# Patient Record
Sex: Male | Born: 1950 | Race: Black or African American | Hispanic: No | Smoking: Never smoker
Health system: Southern US, Community
[De-identification: ages and names within clinical notes are randomized; demographics above are authoritative.]

---

## 1998-09-08 ENCOUNTER — Emergency Department (HOSPITAL_COMMUNITY): Admission: EM | Admit: 1998-09-08 | Discharge: 1998-09-08 | Payer: Self-pay | Admitting: Emergency Medicine

## 1998-09-08 ENCOUNTER — Encounter: Payer: Self-pay | Admitting: Emergency Medicine

## 2013-08-25 ENCOUNTER — Emergency Department (INDEPENDENT_AMBULATORY_CARE_PROVIDER_SITE_OTHER): Payer: Self-pay

## 2013-08-25 ENCOUNTER — Encounter (HOSPITAL_COMMUNITY): Payer: Self-pay | Admitting: Emergency Medicine

## 2013-08-25 ENCOUNTER — Emergency Department (INDEPENDENT_AMBULATORY_CARE_PROVIDER_SITE_OTHER)
Admission: EM | Admit: 2013-08-25 | Discharge: 2013-08-25 | Disposition: A | Discharge status: Home / Self Care | Payer: Self-pay | Source: Home / Self Care

## 2013-08-25 DIAGNOSIS — R918 Other nonspecific abnormal finding of lung field: Secondary | ICD-10-CM

## 2013-08-25 DIAGNOSIS — R0989 Other specified symptoms and signs involving the circulatory and respiratory systems: Secondary | ICD-10-CM

## 2013-08-25 DIAGNOSIS — R0609 Other forms of dyspnea: Secondary | ICD-10-CM

## 2013-08-25 DIAGNOSIS — S0993XA Unspecified injury of face, initial encounter: Secondary | ICD-10-CM

## 2013-08-25 DIAGNOSIS — Z041 Encounter for examination and observation following transport accident: Secondary | ICD-10-CM

## 2013-08-25 DIAGNOSIS — Z043 Encounter for examination and observation following other accident: Secondary | ICD-10-CM

## 2013-08-25 DIAGNOSIS — S025XXA Fracture of tooth (traumatic), initial encounter for closed fracture: Secondary | ICD-10-CM

## 2013-08-25 DIAGNOSIS — R0600 Dyspnea, unspecified: Secondary | ICD-10-CM

## 2013-08-25 MED ORDER — ALBUTEROL SULFATE HFA 108 (90 BASE) MCG/ACT IN AERS: 2.0000 | INHALATION_SPRAY | RESPIRATORY_TRACT | Status: AC | PRN
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
## 2013-08-25 NOTE — Discharge Instructions (Signed)
Dental Injury Your exam shows that you have injured your teeth. The treatment of broken teeth and other dental injuries depends on how badly they are hurt. All dental injuries should be checked as soon as possible by a dentist if there are:  Loose teeth which may need to be wired or bonded with a plastic device to hold them in place.  Broken teeth with exposed tooth pulp which may cause a serious infection.  Painful teeth especially when you bite or chew.  Sharp tooth edges that cut your tongue or lips. Sometimes, antibiotics or pain medicine are prescribed to prevent infection and control pain. Eat a soft or liquid diet and rinse your mouth out after meals with warm water. You should see a dentist or return here at once if you have increased swelling, increased pain or uncontrolled bleeding from the site of your injury. SEEK MEDICAL CARE IF:   You have increased pain not controlled with medicines.  You have swelling around your tooth, in your face or neck.  You have bleeding which starts, continues, or gets worse.  You have a fever. Document Released: 05/19/2005 Document Revised: 08/11/2011 Document Reviewed: 05/18/2009 [facility] Patient Information 2014 [place], [place].  Shortness of Breath Shortness of breath means you have trouble breathing. Shortness of breath may indicate that you have a medical problem. You should seek immediate medical care for shortness of breath. CAUSES   Not enough oxygen in the air (as with high altitudes or a smoke-filled room).  Short-term (acute) lung disease, including:  Infections, such as pneumonia.  Fluid in the lungs, such as heart failure.  A blood clot in the lungs (pulmonary embolism).  Long-term (chronic) lung diseases.  Heart disease (heart attack, angina, heart failure, and others).  Low red blood cells (anemia).  Poor physical fitness. This can cause shortness of breath when you exercise.  Chest or back injuries or  stiffness.  Being overweight.  Smoking.  Anxiety. This can make you feel like you are not getting enough air. DIAGNOSIS  Serious medical problems can usually be found during your physical exam. Tests may also be done to determine why you are having shortness of breath. Tests may include:  Chest X-rays.  Lung function tests.  Blood tests.  Electrocardiography.  Exercise testing.  Echocardiography.  Imaging scans. Your caregiver may not be able to find a cause for your shortness of breath after your exam. In this case, it is important to have a follow-up exam with your caregiver as directed.  TREATMENT  Treatment for shortness of breath depends on the cause of your symptoms and can vary greatly. HOME CARE INSTRUCTIONS   Do not smoke. Smoking is a common cause of shortness of breath. If you smoke, ask for help to quit.  Avoid being around chemicals or things that may bother your breathing, such as paint fumes and dust.  Rest as needed. Slowly resume your usual activities.  If medicines were prescribed, take them as directed for the full length of time directed. This includes oxygen and any inhaled medicines.  Keep all follow-up appointments as directed by your caregiver. SEEK MEDICAL CARE IF:   Your condition does not improve in the time expected.  You have a hard time doing your normal activities even with rest.  You have any side effects or problems with the medicines prescribed.  You develop any new symptoms. SEEK IMMEDIATE MEDICAL CARE IF:   Your shortness of breath gets worse.  You feel lightheaded, faint, or develop  a cough not controlled with medicines.  You start coughing up blood.  You have pain with breathing.  You have chest pain or pain in your arms, shoulders, or abdomen.  You have a fever.  You are unable to walk up stairs or exercise the way you normally do. MAKE SURE YOU:  Understand these instructions.  Will watch your condition.  Will  get help right away if you are not doing well or get worse. Document Released: 02/11/2001 Document Revised: 11/18/2011 Document Reviewed: 08/04/2011 [facility] Patient Information 2014 [place], [place].

## 2013-08-25 NOTE — ED Provider Notes (Signed)
Medical screening examination/treatment/procedure(s) were performed by a resident physician or non-physician practitioner and as the supervising physician I was immediately available for consultation/collaboration.  Evan Corey, MD    Evan S Corey, MD 08/25/13 1814 
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
## 2014-04-11 ENCOUNTER — Encounter (HOSPITAL_COMMUNITY): Payer: Self-pay | Admitting: *Deleted

## 2014-04-11 ENCOUNTER — Emergency Department (INDEPENDENT_AMBULATORY_CARE_PROVIDER_SITE_OTHER)
Admission: EM | Admit: 2014-04-11 | Discharge: 2014-04-11 | Disposition: A | Discharge status: Home / Self Care | Payer: Medicare Other | Source: Home / Self Care | Attending: Family Medicine | Admitting: Family Medicine

## 2014-04-11 DIAGNOSIS — M7552 Bursitis of left shoulder: Secondary | ICD-10-CM

## 2014-04-11 MED ORDER — TRIAMCINOLONE ACETONIDE 40 MG/ML IJ SUSP
INTRAMUSCULAR | Status: AC
  Filled 2014-04-11: qty 2

## 2014-04-11 MED ORDER — BUPIVACAINE HCL (PF) 0.5 % IJ SOLN
INTRAMUSCULAR | Status: AC
  Filled 2014-04-11: qty 10

## 2014-04-11 MED ORDER — DICLOFENAC SODIUM 1 % TD GEL: 4.0000 g | Freq: Four times a day (QID) | TRANSDERMAL | Status: AC

## 2014-04-11 NOTE — ED Provider Notes (Signed)
CSN: [id number]     Arrival date & time 04/11/14  1039 History   First MD Initiated Contact with Patient 04/11/14 1103     Chief Complaint  Patient presents with  . Shoulder Pain   (Consider location/radiation/quality/duration/timing/severity/associated sxs/prior Treatment) Patient is a 63 y.o. male presenting with shoulder pain. The history is provided by the patient.  Shoulder Pain Location:  Shoulder Time since incident:  1 week Injury: no (concern for new mattress contributing during sleep , NKI.)   Shoulder location:  L shoulder Pain details:    Quality:  Throbbing   Radiates to:  Does not radiate   Severity:  Moderate   Onset quality:  Gradual   Progression:  Worsening Chronicity:  New Dislocation: no   Prior injury to area:  No Worsened by:  Nothing tried Ineffective treatments:  None tried Associated symptoms: no back pain, no decreased range of motion, no fever, no neck pain, no stiffness and no tingling     History reviewed. No pertinent past medical history. History reviewed. No pertinent past surgical history. History reviewed. No pertinent family history. History  Substance Use Topics  . Smoking status: Never Smoker   . Smokeless tobacco: Not on file  . Alcohol Use: Yes    Review of Systems  Constitutional: Negative for fever.  Musculoskeletal: Negative for back pain, joint swelling, stiffness and neck pain.    Allergies  Review of patient's allergies indicates no known allergies.  Home Medications   Prior to Admission medications   Medication Sig Start Date End Date Taking? Authorizing Provider  albuterol (PROVENTIL HFA;VENTOLIN HFA) 108 (90 BASE) MCG/ACT inhaler Inhale 2 puffs into the lungs every 4 (four) hours as needed for wheezing or shortness of breath. 08/25/13   Hayden Rasmussenavid Mabe, NP  diclofenac sodium (VOLTAREN) 1 % GEL Apply 4 g topically 4 (four) times daily. To shoulder, please instruct in dosing. 04/11/14   Linna HoffJames D Ysela Hettinger, MD   BP 129/80 mmHg   Temp(Src) 98.3 F (36.8 C) (Oral)  Resp 16  SpO2 98% Physical Exam  Constitutional: He is oriented to person, place, and time. He appears well-developed and well-nourished.  Musculoskeletal: He exhibits tenderness.       Left shoulder: He exhibits tenderness and pain. He exhibits normal range of motion, no bony tenderness, no swelling, no effusion, no deformity, no spasm, normal pulse and normal strength.  Neurological: He is alert and oriented to person, place, and time.  Skin: Skin is warm and dry.  Nursing note and vitals reviewed.   ED Course  ARTHOCENTESIS Date/Time: 04/11/2014 11:16 AM Performed by: Linna HoffKINDL, Christinamarie Tall D Authorized by: Bradd CanaryKINDL, Taran Hable D Consent: Verbal consent obtained. Consent given by: patient Indications: pain  Body area: shoulder Joint: left shoulder Local anesthesia used: no Patient sedated: no Preparation: Patient was prepped and draped in the usual sterile fashion. Needle gauge: 20 G Ultrasound guidance: no Approach: lateral Triamcinolone amount (mg): 80. Bupivacaine 0.50% amount (ml): 5. Patient tolerance: Patient tolerated the procedure well with no immediate complications Comments: Sx improved after inj.   (including critical care time) Labs Review Labs Reviewed - No data to display  Imaging Review No results found.   MDM   1. Bursitis of deltoid, left        Linna HoffJames D Brucha Ahlquist, MD 04/11/14 1136

## 2014-04-11 NOTE — Discharge Instructions (Signed)
Use ice sling and medicine as needed and see orthopedist if further problems.

## 2014-04-11 NOTE — ED Notes (Signed)
Pt  Reports  Pain  l  Shoulder        X  10  Days       denys  Any  specefic  Injury  rom  Is  Present

## 2014-08-08 IMAGING — CR DG CHEST 2V
4 series · 4 of 4 positions shown · non-contrast
Comparison: None.

CLINICAL DATA: Chest pain and shortness of breath

EXAM:
CHEST  2 VIEW

[view not recorded (1 of 4)]
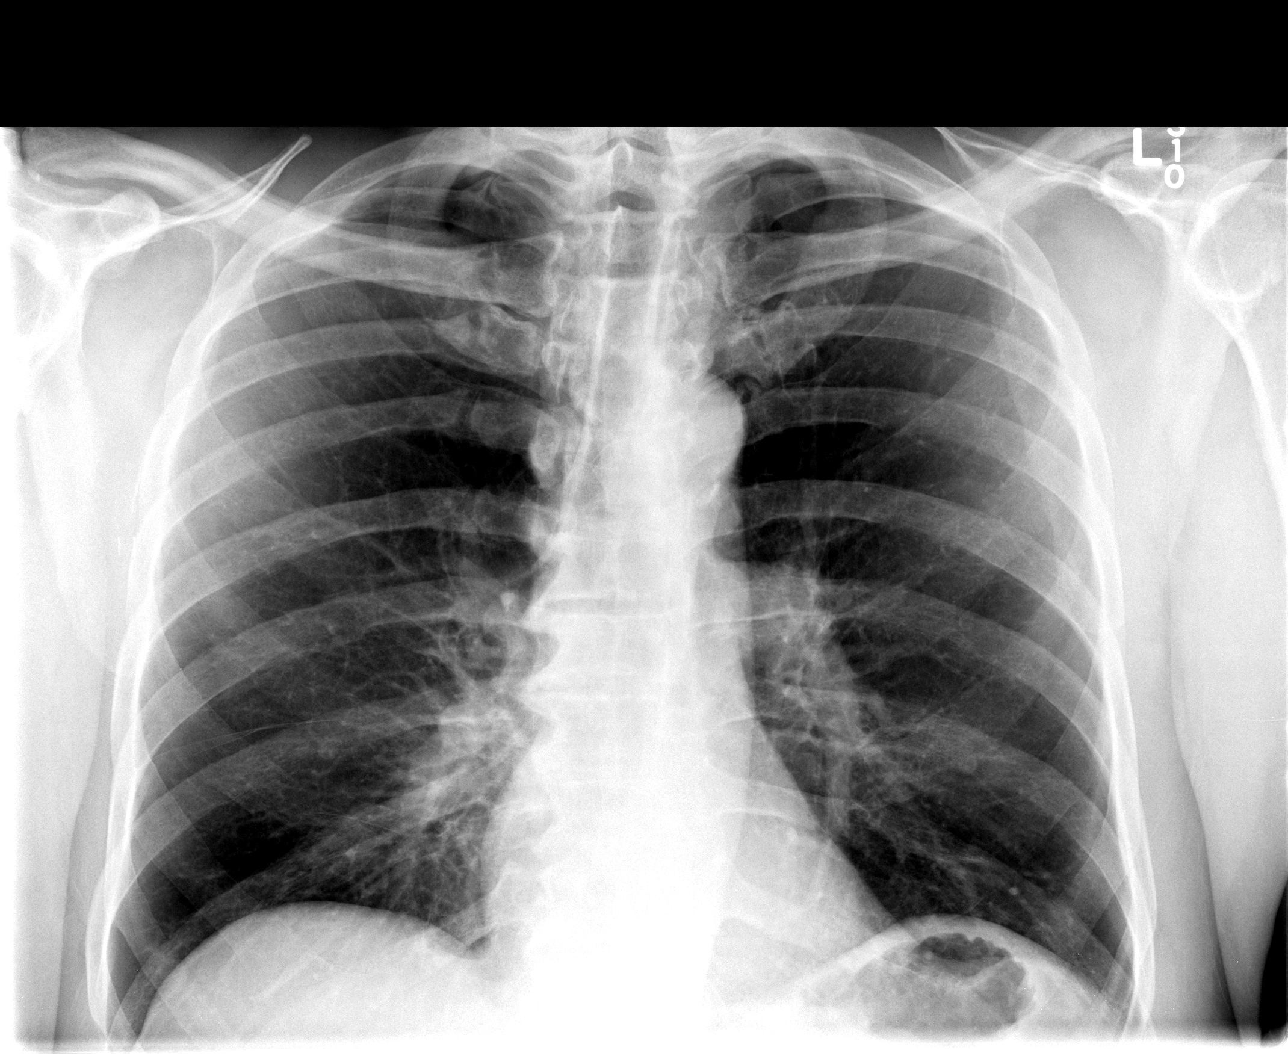

[view not recorded (2 of 4)]
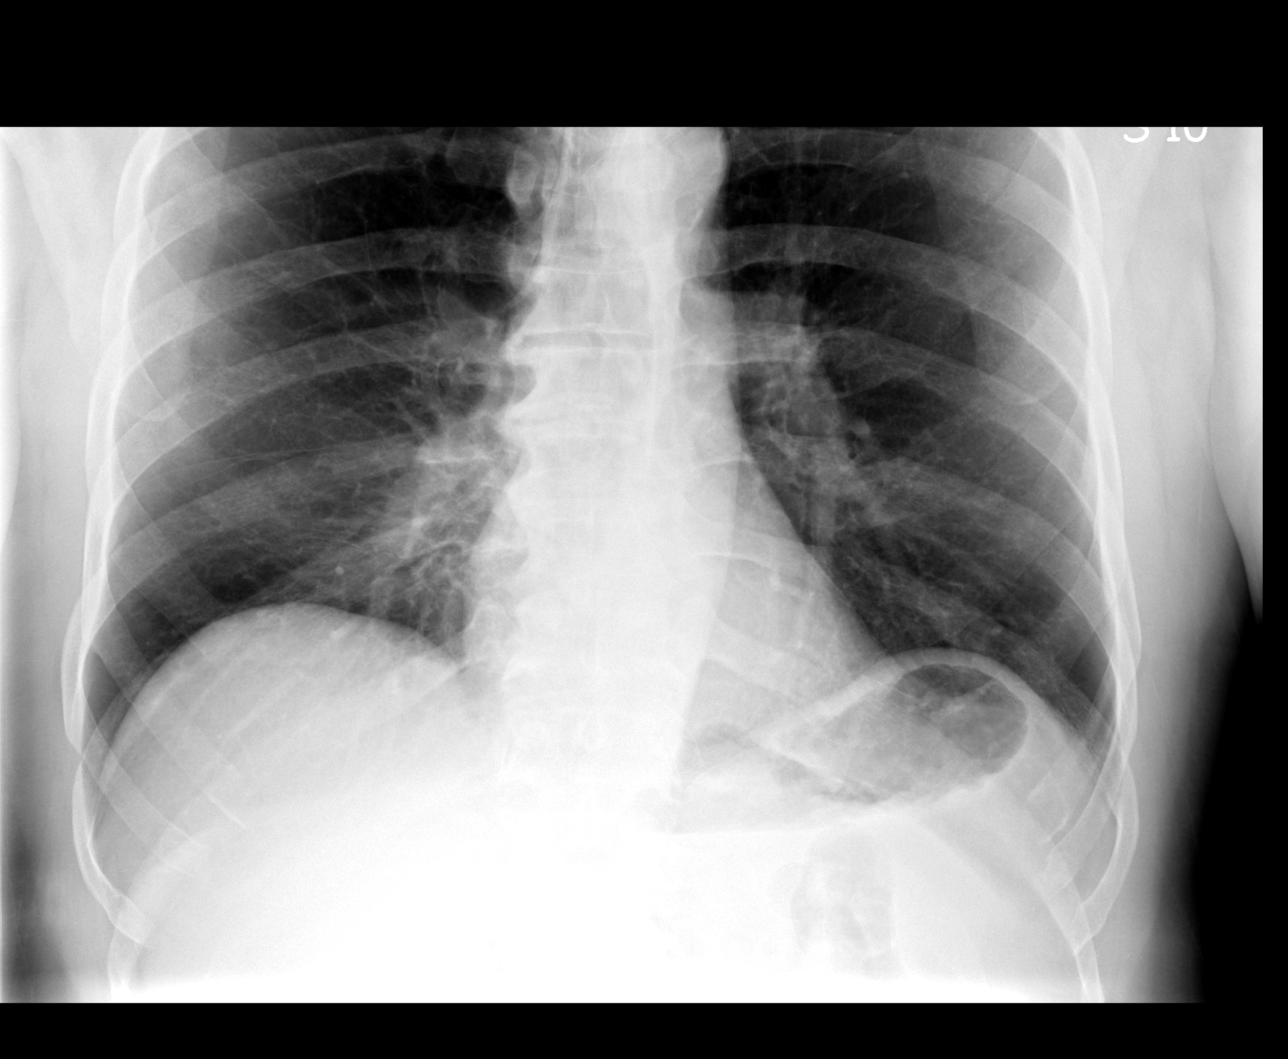

[view not recorded (3 of 4)]
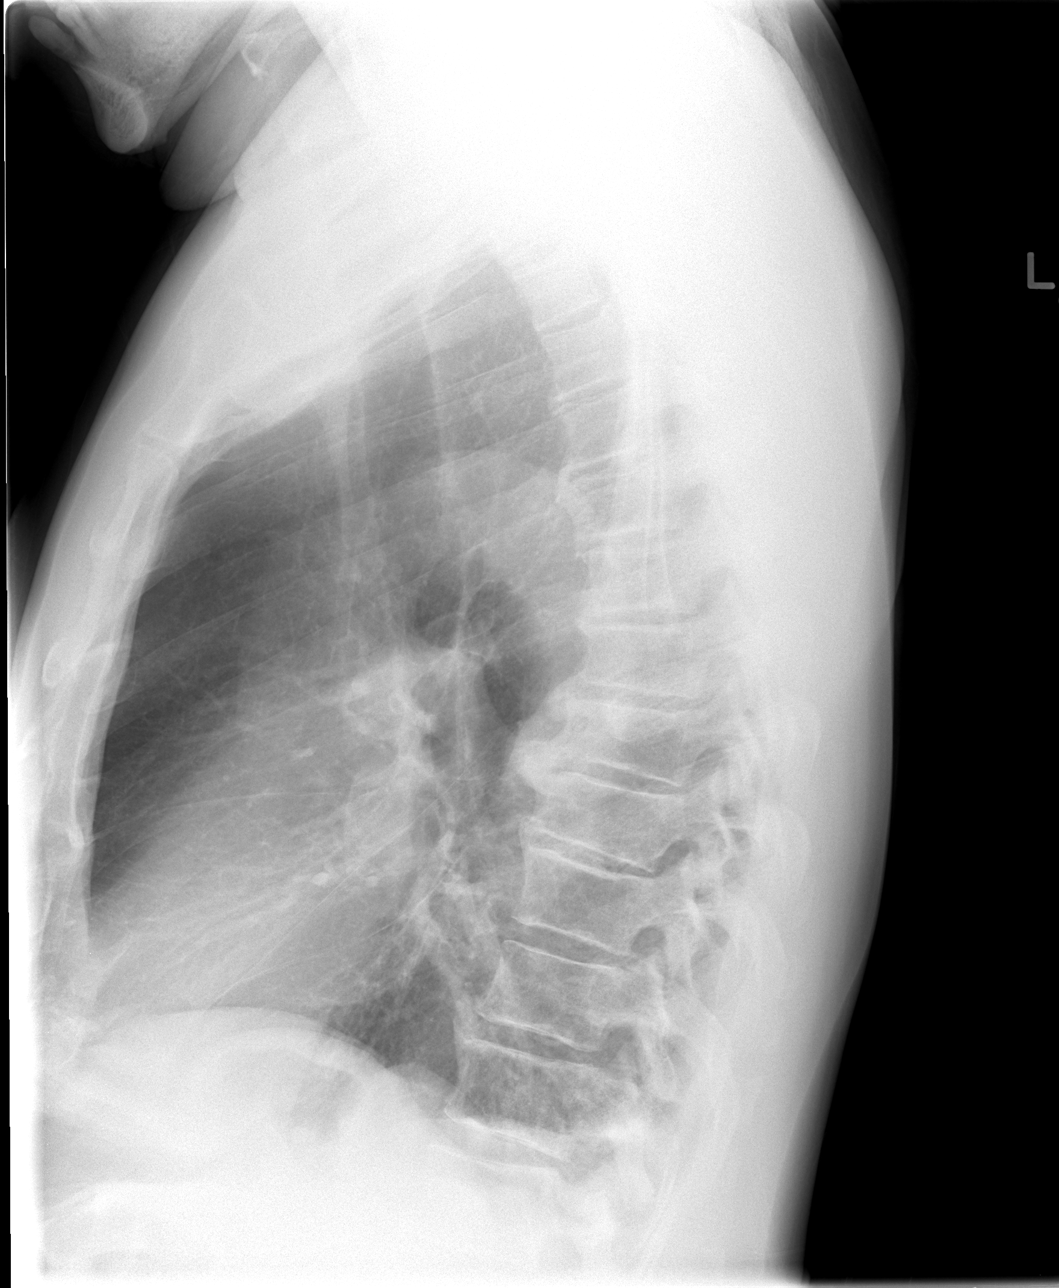

[view not recorded (4 of 4)]
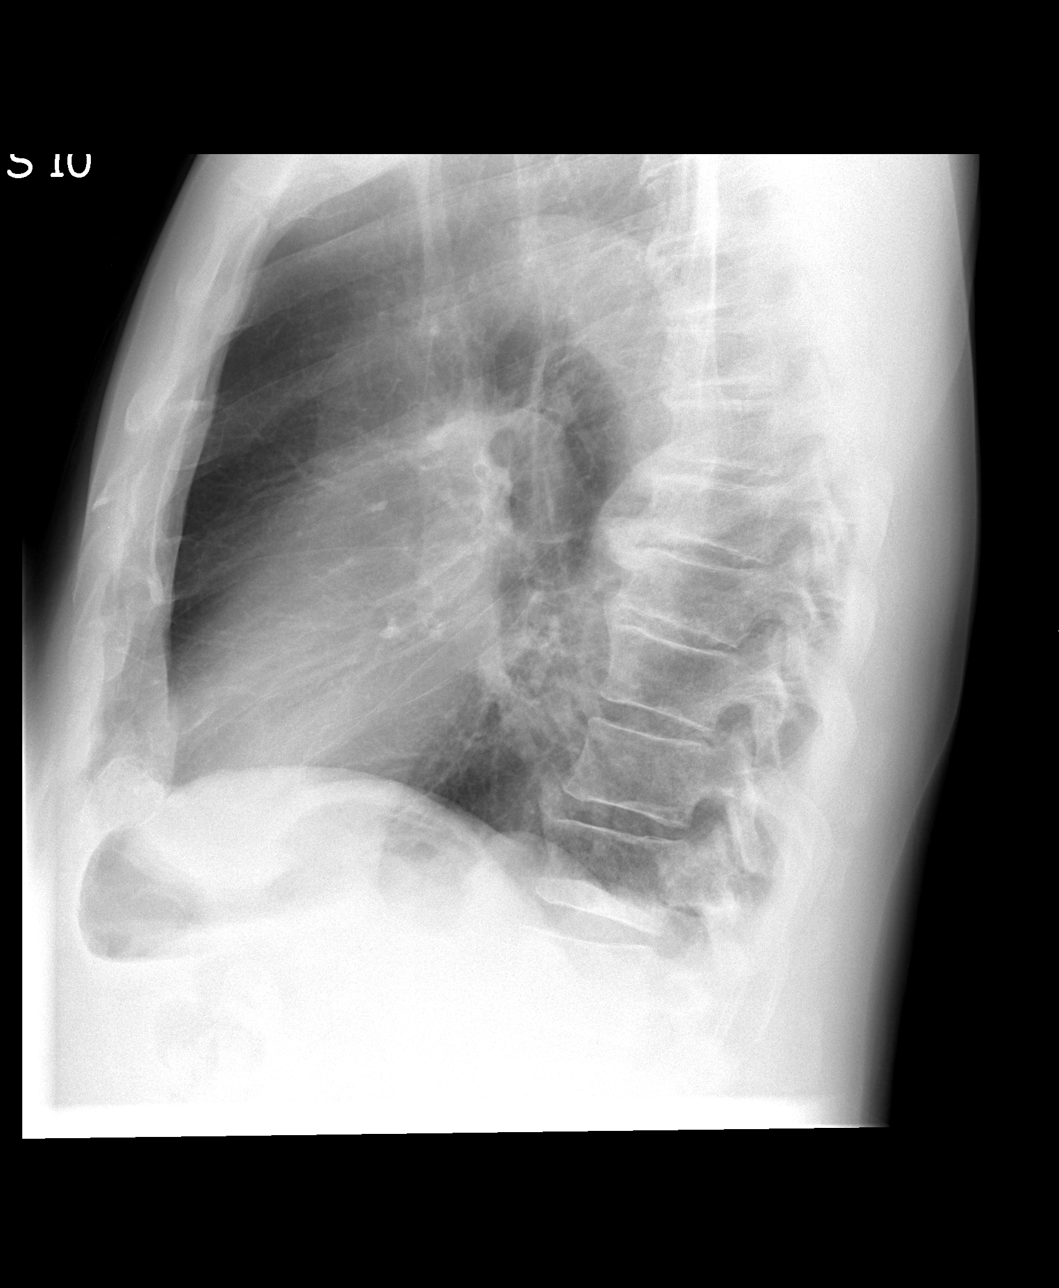

[4 of 4 positions shown; findings below may reference images not displayed]

FINDINGS: Cardiac shadow is within normal limits. The lungs are well aerated
bilaterally. No focal infiltrate or sizable effusion is seen.
Degenerative change of thoracic spine is noted. The vague density is
noted predominately on the lateral projection superimposed over the
spine. This is likely related to a constricting wounds of vascular
parenchymal shadows although the possibility of an underlying medial
lung lesion cannot be totally excluded. Nonemergent CT of the chest
without contrast is recommended.
IMPRESSION: Vague density overlying spine on the lateral projection. This is
likely artifactual in nature although the possibility of an
underlying lesion cannot be totally excluded. Nonemergent CT of the
chest is recommended.

## 2015-04-28 ENCOUNTER — Emergency Department (HOSPITAL_COMMUNITY)
Admission: EM | Admit: 2015-04-28 | Discharge: 2015-05-03 | Disposition: E | Payer: Commercial Managed Care - HMO | Attending: Emergency Medicine | Admitting: Emergency Medicine

## 2015-04-28 DIAGNOSIS — I469 Cardiac arrest, cause unspecified: Secondary | ICD-10-CM | POA: Diagnosis present

## 2015-04-28 DIAGNOSIS — Z791 Long term (current) use of non-steroidal anti-inflammatories (NSAID): Secondary | ICD-10-CM | POA: Diagnosis not present

## 2015-04-28 DIAGNOSIS — Z79899 Other long term (current) drug therapy: Secondary | ICD-10-CM | POA: Insufficient documentation

## 2015-04-28 MED ORDER — CALCIUM CHLORIDE 10 % IV SOLN
INTRAVENOUS | Status: AC | PRN
  Administered 2015-04-28: 1 g via INTRAVENOUS

## 2015-04-28 MED ORDER — EPINEPHRINE HCL 0.1 MG/ML IJ SOSY
PREFILLED_SYRINGE | INTRAMUSCULAR | Status: AC | PRN
  Administered 2015-04-28: 1 via INTRAVENOUS

## 2015-04-28 MED ORDER — SODIUM BICARBONATE 8.4 % IV SOLN
INTRAVENOUS | Status: AC | PRN
  Administered 2015-04-28: 50 meq via INTRAVENOUS

## 2015-04-29 ENCOUNTER — Encounter (HOSPITAL_COMMUNITY): Payer: Self-pay | Admitting: Emergency Medicine

## 2015-05-02 MED FILL — Medication: Qty: 1 | Status: AC
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
## 2015-05-03 NOTE — Progress Notes (Signed)
RT responded to ED Trauma C for a pt coming in with EMS in cardiac arrest. Pt intubated with 8.0 ett by EMS in field. Pt being manually ventilated throughout code. Pt expired at 17:22. ETT remaining in place pending possible ME case. RT will continue to monitor for extubation pending ME.

## 2015-05-03 NOTE — ED Notes (Signed)
Pt was intubated with 8.0 ETT prior to arrival to the ED.
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
## 2015-05-03 NOTE — Progress Notes (Signed)
ett removed by RN per MD

## 2015-05-03 NOTE — ED Notes (Signed)
Per DR. Liu pt is not an ME case and all tubes and lines can be removed.
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
## 2015-05-03 NOTE — Code Documentation (Addendum)
Patient time of death occurred at 1723. 

## 2015-05-03 NOTE — ED Notes (Signed)
Jasmine in pod c aware pt complete and eye prep to be completed prior to going to morgue.

## 2015-05-03 DEATH — deceased
